# Patient Record
Sex: Male | Born: 1995 | Race: White | Hispanic: No | Marital: Single | Smoking: Never smoker
Health system: Southern US, Community
[De-identification: ages and names within clinical notes are randomized; demographics above are authoritative.]

## PROBLEM LIST (undated history)

## (undated) DIAGNOSIS — F32A Depression, unspecified: Secondary | ICD-10-CM

## (undated) DIAGNOSIS — F329 Major depressive disorder, single episode, unspecified: Secondary | ICD-10-CM

## (undated) HISTORY — DX: Major depressive disorder, single episode, unspecified: F32.9

## (undated) HISTORY — DX: Depression, unspecified: F32.A

---
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
## 2013-09-08 IMAGING — CR DG SHOULDER 2+V*L*
3 series · 3 of 3 positions shown · non-contrast
Comparison: None.

CLINICAL DATA: Shoulder pain.

LEFT SHOULDER - 2+ VIEW

[ap int rot]
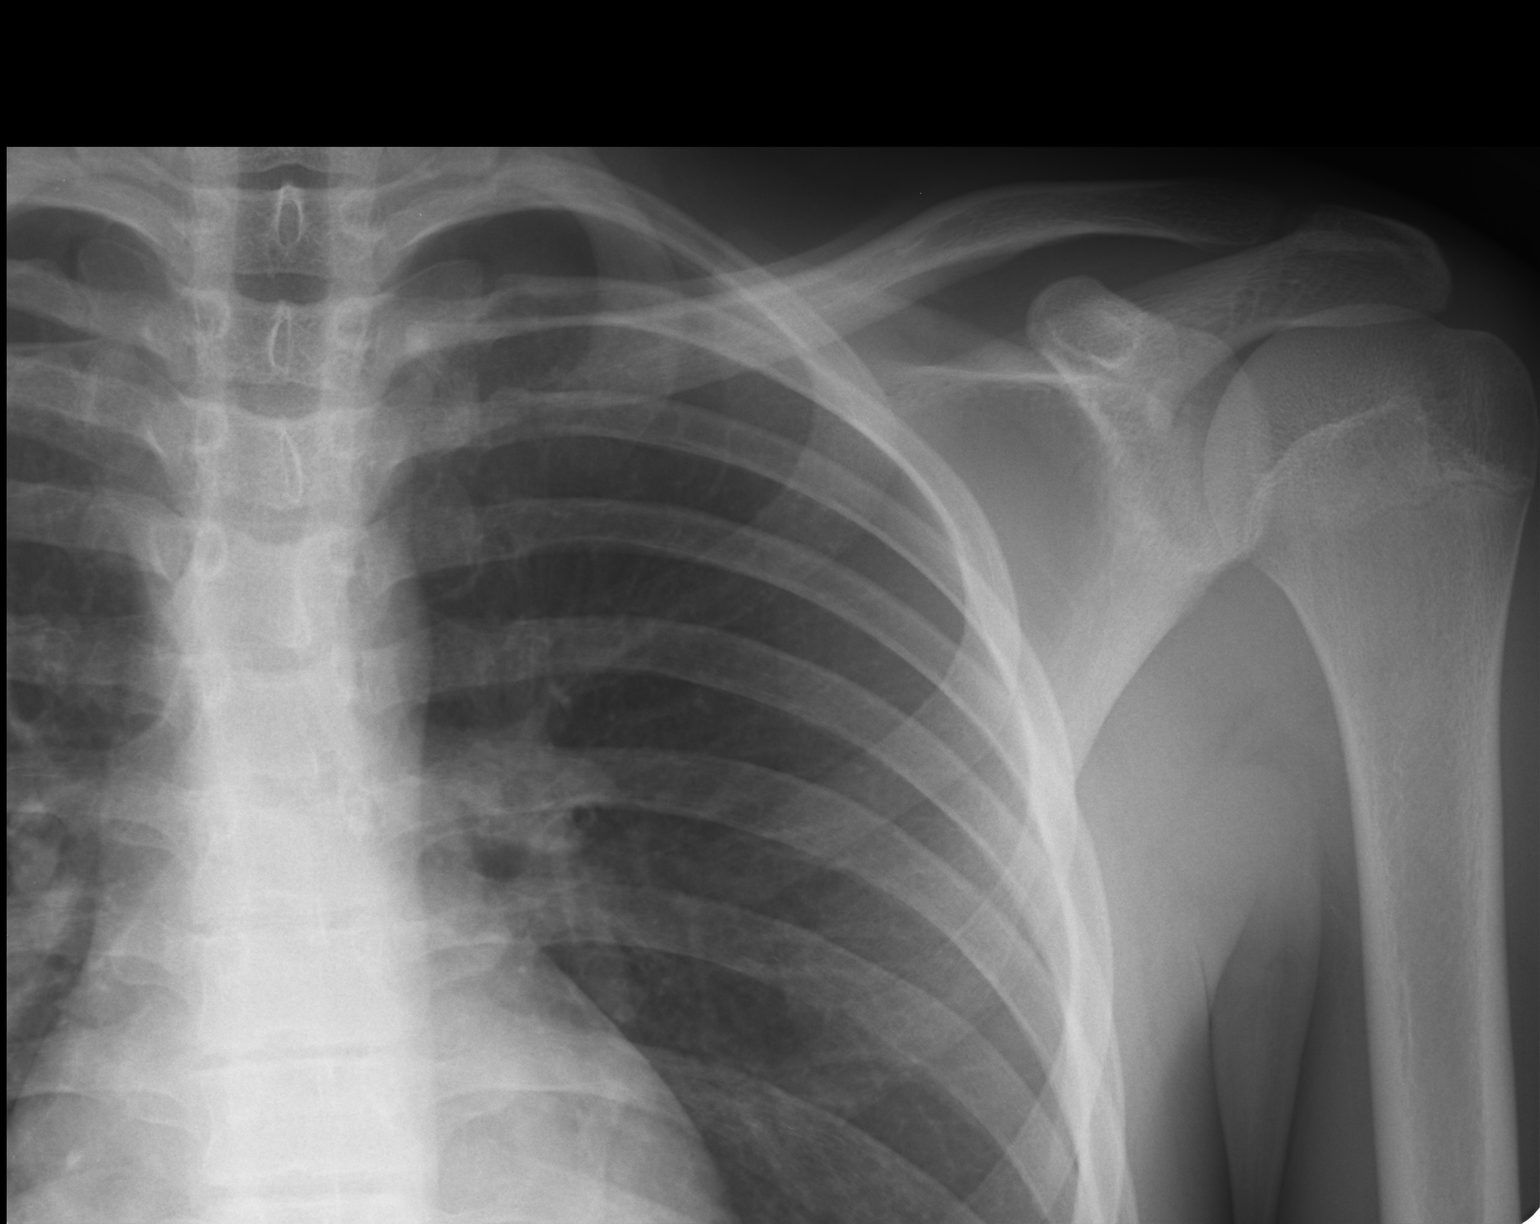

[ap ext rot]
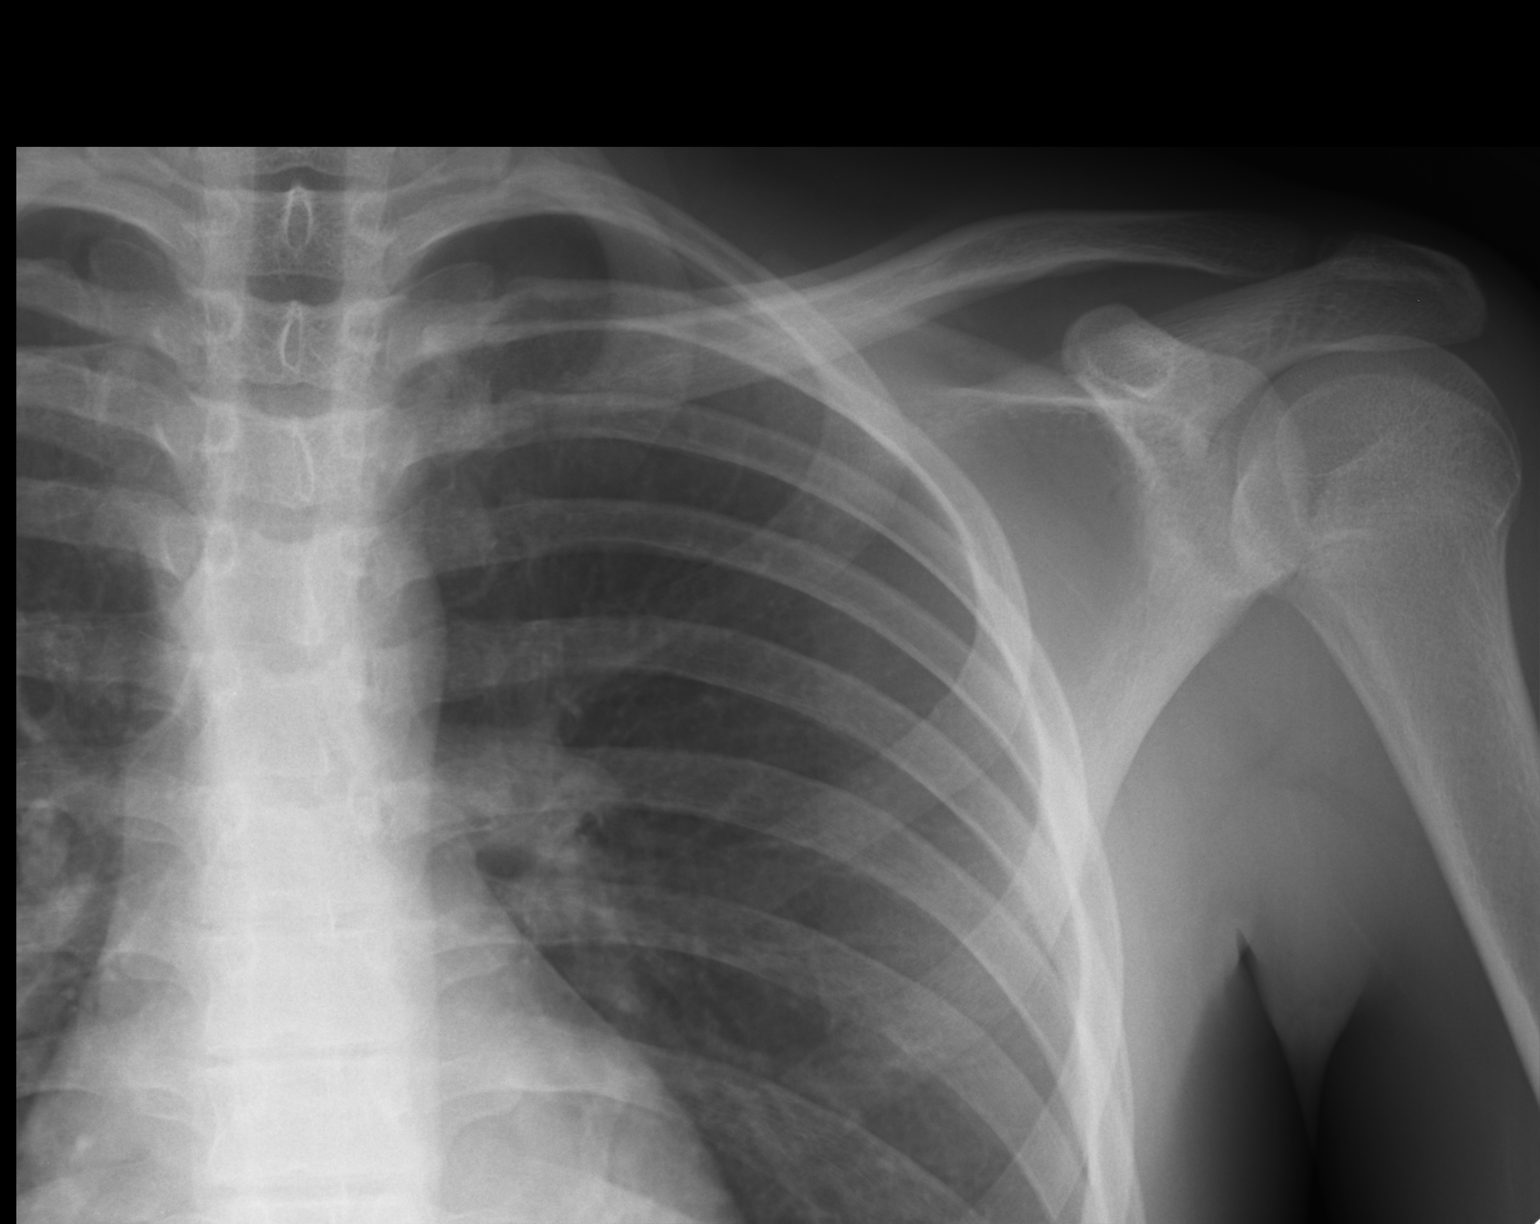

[AP]
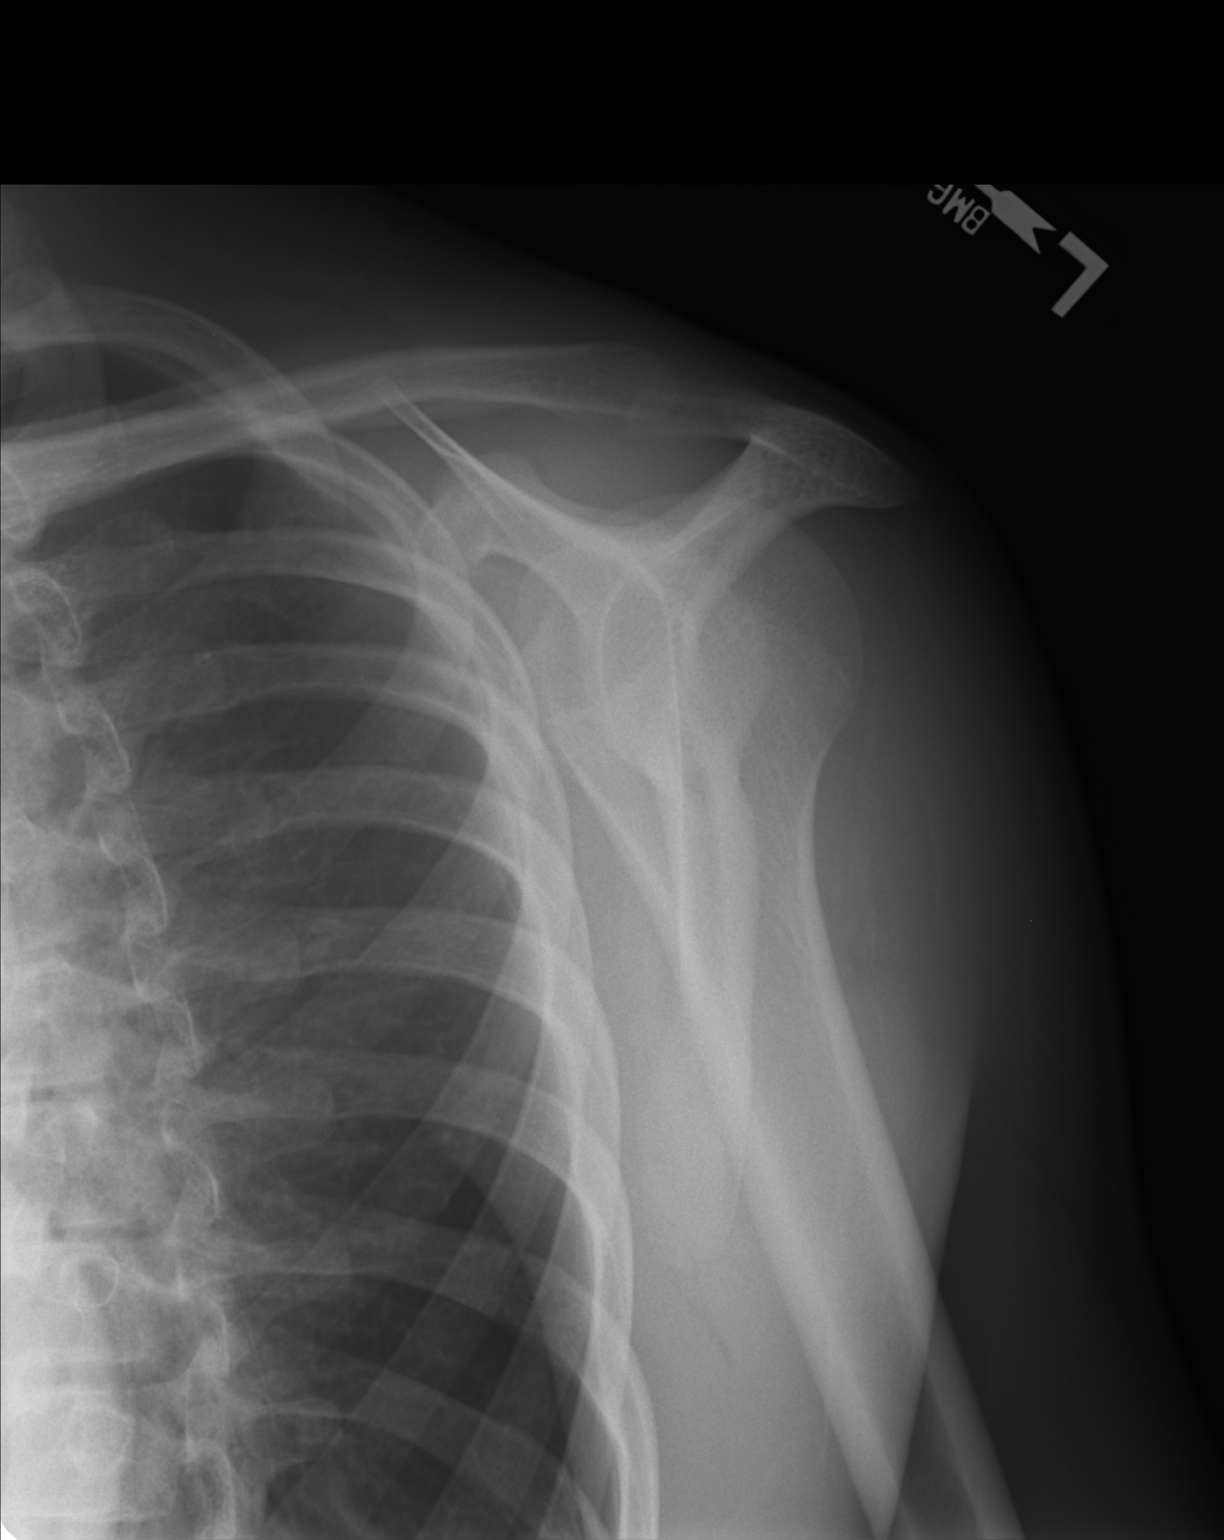

[3 of 3 positions shown; findings below may reference images not displayed]

FINDINGS: No fracture or dislocation.  No degenerative changes.
Visualized portion of the left chest is unremarkable.
IMPRESSION: Negative.

## 2014-05-06 ENCOUNTER — Encounter (HOSPITAL_BASED_OUTPATIENT_CLINIC_OR_DEPARTMENT_OTHER): Payer: Self-pay

## 2014-05-06 DIAGNOSIS — S61012A Laceration without foreign body of left thumb without damage to nail, initial encounter: Secondary | ICD-10-CM | POA: Insufficient documentation

## 2014-05-06 DIAGNOSIS — Y288XXA Contact with other sharp object, undetermined intent, initial encounter: Secondary | ICD-10-CM | POA: Insufficient documentation

## 2014-05-06 DIAGNOSIS — Y9289 Other specified places as the place of occurrence of the external cause: Secondary | ICD-10-CM | POA: Insufficient documentation

## 2014-05-06 DIAGNOSIS — F329 Major depressive disorder, single episode, unspecified: Secondary | ICD-10-CM | POA: Insufficient documentation

## 2014-05-06 DIAGNOSIS — Y990 Civilian activity done for income or pay: Secondary | ICD-10-CM | POA: Insufficient documentation

## 2014-05-06 DIAGNOSIS — Y9389 Activity, other specified: Secondary | ICD-10-CM | POA: Insufficient documentation

## 2014-05-06 DIAGNOSIS — Z79899 Other long term (current) drug therapy: Secondary | ICD-10-CM | POA: Insufficient documentation

## 2014-05-06 DIAGNOSIS — Z791 Long term (current) use of non-steroidal anti-inflammatories (NSAID): Secondary | ICD-10-CM | POA: Insufficient documentation

## 2014-05-06 NOTE — ED Notes (Signed)
Pt reports was at work, cut left thumb on tomato slicer.

## 2014-05-07 ENCOUNTER — Emergency Department (HOSPITAL_BASED_OUTPATIENT_CLINIC_OR_DEPARTMENT_OTHER)
Admission: EM | Admit: 2014-05-07 | Discharge: 2014-05-07 | Disposition: A | Discharge status: Home / Self Care | Payer: PRIVATE HEALTH INSURANCE | Attending: Emergency Medicine | Admitting: Emergency Medicine

## 2014-05-07 DIAGNOSIS — S61012A Laceration without foreign body of left thumb without damage to nail, initial encounter: Secondary | ICD-10-CM

## 2014-05-07 NOTE — ED Notes (Signed)
Assumed care of patient from Kelly, RN.

## 2014-05-07 NOTE — ED Provider Notes (Signed)
CSN: [id number]     Arrival date & time 05/06/14  2116 History  This chart was scribed for Randy LibraJohn Brunetta Newingham, MD by Tonye RoyaltyJoshua Chen, ED Scribe. This patient was seen in room MH09/MH09 and the patient's care was started at 12:59 AM.    Chief Complaint  Patient presents with  . Extremity Laceration   The history is provided by the patient. No language interpreter was used.    HPI Comments: Randy Cole is a 19 y.o. male who presents to the Emergency Department complaining of lacerations to his left thumb at 2100 tonight. He states he was working at [employer] and cut his thumb on a tomato slicer. He states pain is mild now. He states his tetanus is up to date. Bleeding is controlled. He denies other injury.  Past Medical History  Diagnosis Date  . Depression    History reviewed. No pertinent past surgical history. No family history on file. History  Substance Use Topics  . Smoking status: Never Smoker   . Smokeless tobacco: Not on file  . Alcohol Use: No    Review of Systems A complete 10 system review of systems was obtained and all systems are negative except as noted in the HPI and PMH.    Allergies  Review of patient's allergies indicates no known allergies.  Home Medications   Prior to Admission medications   Medication Sig Start Date End Date Taking? Authorizing Provider  citalopram (CELEXA) 10 MG tablet Take 1 tablet (10 mg total) by mouth daily. 11/02/11   Edwyna Perfecthomas W Hodgin, MD  meloxicam (MOBIC) 15 MG tablet Take 1 tablet (15 mg total) by mouth daily. 10/24/11   Tonye Pearsonobert P Doolittle, MD   BP 146/79 mmHg  Pulse 72  Temp(Src) 98.9 F (37.2 C) (Oral)  Resp 18  Ht 6\' 1"  (1.854 m)  Wt 160 lb (72.576 kg)  BMI 21.11 kg/m2  SpO2 97%   Physical Exam  Nursing note and vitals reviewed. General: Well-developed, well-nourished male in no acute distress; appearance consistent with age of record HENT: normocephalic; atraumatic Eyes: normal appearance Neck: supple Heart: regular rate and  rhythm Lungs: clear to auscultation bilaterally Abdomen: soft; nondistended; nontender; bowel sounds present Extremities: No deformity; full range of motion; 3 transverse lacerations to the pad of the left thumb with mild tenderness Neurologic: Awake, alert and oriented; motor function intact in all extremities and symmetric; no facial droop Skin: Warm and dry Psychiatric: Normal mood and affect  ED Course  Procedures (including critical care time)  DIAGNOSTIC STUDIES: Oxygen Saturation is 97% on room air, normal by my interpretation.    COORDINATION OF CARE: 1:02 AM Discussed treatment plan with patient at beside, the patient agrees with the plan and has no further questions at this time.  Wounds irrigated, then closed with skin adhesive by Christoper Allegraatyana Kirichinko, PA-C.  MDM   Final diagnoses:  Laceration of left thumb, initial encounter   I personally performed the services described in this documentation, which was scribed in my presence. The recorded information has been reviewed and is accurate.   Randy LibraJohn Lyrah Bradt, MD 05/07/14 [phone]

## 2014-05-07 NOTE — ED Notes (Signed)
I completed wound care with kerlix wrap, then secured with tape, then fit and placed metal finger splint, secured with coban. Patient ready for discharge.
